# Patient Record
Sex: Male | Born: 1950 | Race: White | Hispanic: No | Marital: Married | Smoking: Never smoker
Health system: Southern US, Community
[De-identification: ages and names within clinical notes are randomized; demographics above are authoritative.]

## PROBLEM LIST (undated history)

## (undated) DIAGNOSIS — E7401 von Gierke disease: Secondary | ICD-10-CM

## (undated) DIAGNOSIS — J45909 Unspecified asthma, uncomplicated: Secondary | ICD-10-CM

## (undated) DIAGNOSIS — I4891 Unspecified atrial fibrillation: Secondary | ICD-10-CM

## (undated) DIAGNOSIS — B019 Varicella without complication: Secondary | ICD-10-CM

## (undated) DIAGNOSIS — N200 Calculus of kidney: Secondary | ICD-10-CM

## (undated) DIAGNOSIS — E785 Hyperlipidemia, unspecified: Secondary | ICD-10-CM

## (undated) DIAGNOSIS — D126 Benign neoplasm of colon, unspecified: Secondary | ICD-10-CM

## (undated) DIAGNOSIS — N529 Male erectile dysfunction, unspecified: Secondary | ICD-10-CM

## (undated) DIAGNOSIS — I10 Essential (primary) hypertension: Secondary | ICD-10-CM

## (undated) DIAGNOSIS — B059 Measles without complication: Secondary | ICD-10-CM

## (undated) HISTORY — DX: Male erectile dysfunction, unspecified: N52.9

## (undated) HISTORY — DX: Calculus of kidney: N20.0

## (undated) HISTORY — DX: Benign neoplasm of colon, unspecified: D12.6

## (undated) HISTORY — DX: Measles without complication: B05.9

## (undated) HISTORY — DX: Essential (primary) hypertension: I10

## (undated) HISTORY — DX: Varicella without complication: B01.9

## (undated) HISTORY — PX: COLONOSCOPY: SHX174

## (undated) HISTORY — DX: Unspecified asthma, uncomplicated: J45.909

## (undated) HISTORY — DX: Unspecified atrial fibrillation: I48.91

## (undated) HISTORY — DX: Hyperlipidemia, unspecified: E78.5

## (undated) HISTORY — DX: Von Gierke disease: E74.01

---

## 1980-06-21 HISTORY — PX: CIRCUMCISION: SUR203

## 2000-06-21 HISTORY — PX: ACHILLES TENDON SURGERY: SHX542

## 2012-07-18 ENCOUNTER — Ambulatory Visit (INDEPENDENT_AMBULATORY_CARE_PROVIDER_SITE_OTHER): Admitting: Cardiovascular Disease

## 2012-07-18 ENCOUNTER — Encounter: Payer: Self-pay | Admitting: Cardiovascular Disease

## 2012-07-18 VITALS — BP 138/84 | HR 71 | Ht 74.0 in | Wt 222.0 lb

## 2012-07-18 DIAGNOSIS — E785 Hyperlipidemia, unspecified: Secondary | ICD-10-CM

## 2012-07-18 DIAGNOSIS — I4891 Unspecified atrial fibrillation: Secondary | ICD-10-CM

## 2012-07-18 DIAGNOSIS — I10 Essential (primary) hypertension: Secondary | ICD-10-CM | POA: Insufficient documentation

## 2012-07-18 MED ORDER — PROPRANOLOL HCL 20 MG PO TABS: 20.0000 mg | ORAL_TABLET | Freq: Three times a day (TID) | ORAL | Status: DC | PRN

## 2012-07-18 MED ORDER — DILTIAZEM HCL 30 MG PO TABS: 30.0000 mg | ORAL_TABLET | Freq: Three times a day (TID) | ORAL | Status: DC | PRN

## 2012-07-18 NOTE — Patient Instructions (Addendum)
You are doing well. No medication changes were made.  Please take diltiazem 30 mg dose and propranolol 20 mg dose if you go into atrial fibrillation. Call the office  Please call us if you have new issues that need to be addressed before your next appt.  Your physician wants you to follow-up in: 6 months.  You will receive a reminder letter in the mail two months in advance. If you don't receive a letter, please call our office to schedule the follow-up appointment.

## 2012-07-18 NOTE — Assessment & Plan Note (Signed)
Rare episodes of atrial fibrillation. We have given him a prescription for diltiazem 30 mg to take as needed, also propranolol 20 mg for breakthrough arrhythmia. He can take these together if no resolution of his arrhythmia in several hours, could repeat the dosing. We have asked him to call our office if he has prolonged arrhythmia. Long history of atrial fibrillation dating back over 10 years. No further workup at this time.

## 2012-07-18 NOTE — Assessment & Plan Note (Signed)
We did talk to him for a long time about cholesterol medication and hyperlipidemia. He is a very strong family history which concerns me. We'll try to obtain the cholesterol from recent blood work for insurance and make an evaluation based on these numbers. One option would be to try medication such as WelChol, fenofibrate, zetia for his hyperlipidemia.

## 2012-07-18 NOTE — Progress Notes (Signed)
Patient ID: Eugene Hodges, male    DOB: Oct 24, 1950, 62 y.o.   MRN: [id number]  HPI Comments: Mr. Eugene Hodges is a 62 year old gentleman patient of Dr. Juanetta Gosling, history of paroxysmal atrial fibrillation who presents to establish care. No recent echocardiogram or stress testing  He reports having history of hypertension, strong family history of heart disease. Brother died in his 46s, sister died in her 61s, father died in his 54s all from cardiac disease. He does not want to be on a statin. He is on a statin for 12 years and stopped this on his own after some myalgias. After reading about statins, he has decided he does not want to be on a statin. He previously tried Zocor, Lipitor, Crestor.  He does wear having rare episodes of atrial fibrillation, 3-D her episodes, several smaller episodes. Typically they last less than 24 hours. He has one episode lasting 24 hours once per year. Last episode was January of this year 2014 lasting for 6 hours. He does not have any medications that he takes on an as-needed basis for breakthrough atrial fibrillation. He does have occasional PVCs and reports sometimes is unable to tell the difference between ectopy and atrial fibrillation.  Otherwise he reports that he is doing well with no complaints. He is active, likes to workout on a regular basis and watch his diet. He does not know his most recent cholesterol though he had recent lab work for insurance  EKG shows normal sinus rhythm with rate 71 beats per minute with no significant ST or T wave changes   Outpatient Encounter Prescriptions as of 07/18/2012  Medication Sig Dispense Refill  . aspirin 325 MG tablet Take 325 mg by mouth daily.      Marland Kitchen diltiazem (DILACOR XR) 240 MG 24 hr capsule Take 240 mg by mouth daily.      Marland Kitchen lisinopril (PRINIVIL,ZESTRIL) 10 MG tablet Take 10 mg by mouth daily.      . meloxicam (MOBIC) 15 MG tablet Take 15 mg by mouth daily.      . vardenafil (LEVITRA) 20 MG tablet Take 20 mg by  mouth daily as needed.        Review of Systems  Constitutional: Negative.   HENT: Negative.   Eyes: Negative.   Respiratory: Negative.   Cardiovascular: Negative.   Gastrointestinal: Negative.   Musculoskeletal: Negative.   Skin: Negative.   Neurological: Negative.   Hematological: Negative.   Psychiatric/Behavioral: Negative.   All other systems reviewed and are negative.    BP 138/84  Pulse 71  Ht 6\' 2"  (1.88 m)  Wt 222 lb (100.699 kg)  BMI 28.50 kg/m2  Physical Exam  Nursing note and vitals reviewed. Constitutional: He is oriented to person, place, and time. He appears well-developed and well-nourished.  HENT:  Head: Normocephalic.  Nose: Nose normal.  Mouth/Throat: Oropharynx is clear and moist.  Eyes: Conjunctivae normal are normal. Pupils are equal, round, and reactive to light.  Neck: Normal range of motion. Neck supple. No JVD present.  Cardiovascular: Normal rate, regular rhythm, S1 normal, S2 normal, normal heart sounds and intact distal pulses.  Exam reveals no gallop and no friction rub.   No murmur heard. Pulmonary/Chest: Effort normal and breath sounds normal. No respiratory distress. He has no wheezes. He has no rales. He exhibits no tenderness.  Abdominal: Soft. Bowel sounds are normal. He exhibits no distension. There is no tenderness.  Musculoskeletal: Normal range of motion. He exhibits no edema and no tenderness.  Lymphadenopathy:    He has no cervical adenopathy.  Neurological: He is alert and oriented to person, place, and time. Coordination normal.  Skin: Skin is warm and dry. No rash noted. No erythema.  Psychiatric: He has a normal mood and affect. His behavior is normal. Judgment and thought content normal.           Assessment and Plan

## 2012-07-18 NOTE — Assessment & Plan Note (Signed)
Blood pressures relatively well controlled. We'll continue on diltiazem for blood pressure and heart rhythm control.

## 2013-01-17 ENCOUNTER — Ambulatory Visit (INDEPENDENT_AMBULATORY_CARE_PROVIDER_SITE_OTHER): Admitting: Cardiovascular Disease

## 2013-01-17 ENCOUNTER — Encounter: Payer: Self-pay | Admitting: Cardiovascular Disease

## 2013-01-17 VITALS — BP 120/88 | HR 77 | Ht 73.5 in | Wt 223.8 lb

## 2013-01-17 DIAGNOSIS — E785 Hyperlipidemia, unspecified: Secondary | ICD-10-CM

## 2013-01-17 DIAGNOSIS — I4949 Other premature depolarization: Secondary | ICD-10-CM

## 2013-01-17 DIAGNOSIS — I4891 Unspecified atrial fibrillation: Secondary | ICD-10-CM

## 2013-01-17 DIAGNOSIS — I10 Essential (primary) hypertension: Secondary | ICD-10-CM

## 2013-01-17 DIAGNOSIS — I493 Ventricular premature depolarization: Secondary | ICD-10-CM

## 2013-01-17 MED ORDER — CHOLINE FENOFIBRATE 135 MG PO CPDR: 135.0000 mg | DELAYED_RELEASE_CAPSULE | Freq: Every day | ORAL | Status: DC

## 2013-01-17 NOTE — Assessment & Plan Note (Signed)
PVCs back in May. If symptoms get worse, would repeat a Holter monitor. If excessive amount of ectopy, could try antiarrhythmic medication.

## 2013-01-17 NOTE — Assessment & Plan Note (Signed)
Cholesterol close to 260. He's not interested in a statin. Will start trilipex daily. Recheck cholesterol in 3 months time. Could add zetia 10 mg daily at that time if numbers are still high.

## 2013-01-17 NOTE — Addendum Note (Signed)
Addended by: Antonieta Iba on: 01/17/2013 06:23 PM   Modules accepted: Level of Service

## 2013-01-17 NOTE — Progress Notes (Signed)
Patient ID: Eugene Hodges, male    DOB: 04-Feb-1951, 62 y.o.   MRN: [id number]  HPI Comments: Mr. Eugene Hodges is a 62 year-old gentleman patient of Dr. Juanetta Gosling, history of paroxysmal atrial fibrillation who presents for routine followup No recent echocardiogram or stress testing  history of hypertension, strong family history of heart disease. Brother died in his 37s, sister died in her 7s, father died in his 48s all from cardiac disease. He does not want to be on a statin. He is on a statin for 12 years and stopped this on his own after some myalgias. After reading about statins, he has decided he does not want to be on a statin. He previously tried Zocor, Lipitor, Crestor.  He reports having palpitations in may 2014 consistent with PVCs. He had prior workup for ectopy, wore a Holter in the past showing PVCs. He does not think his ectopy was atrial fibrillation. Otherwise she feels well, is active. No symptoms concerning for atrial fibrillation. For his PVCs, he did take short acting diltiazem and propranolol. Uncertain if this helped.   He is active, likes to workout on a regular basis and watch his diet.   Cholesterol was 258 to his insurance workup  EKG shows normal sinus rhythm with rate 77 beats per minute with no significant ST or T wave changes   Outpatient Encounter Prescriptions as of 01/17/2013  Medication Sig Dispense Refill  . aspirin 325 MG tablet Take 325 mg by mouth daily.      Marland Kitchen diltiazem (CARDIZEM) 30 MG tablet Take 1 tablet (30 mg total) by mouth 3 (three) times daily as needed.  90 tablet  6  . diltiazem (DILACOR XR) 240 MG 24 hr capsule Take 240 mg by mouth daily.      Marland Kitchen lisinopril (PRINIVIL,ZESTRIL) 10 MG tablet Take 10 mg by mouth daily.      . propranolol (INDERAL) 20 MG tablet Take 1 tablet (20 mg total) by mouth 3 (three) times daily as needed.  90 tablet  6  . vardenafil (LEVITRA) 20 MG tablet Take 20 mg by mouth daily as needed.      . Choline Fenofibrate 135 MG  capsule Take 1 capsule (135 mg total) by mouth daily.  90 capsule  3    Review of Systems  Constitutional: Negative.   HENT: Negative.   Eyes: Negative.   Respiratory: Negative.   Cardiovascular: Negative.   Gastrointestinal: Negative.   Musculoskeletal: Negative.   Skin: Negative.   Neurological: Negative.   Psychiatric/Behavioral: Negative.   All other systems reviewed and are negative.    BP 120/88  Pulse 77  Ht 6' 1.5" (1.867 m)  Wt 223 lb 12 oz (101.492 kg)  BMI 29.12 kg/m2  Physical Exam  Nursing note and vitals reviewed. Constitutional: He is oriented to person, place, and time. He appears well-developed and well-nourished.  HENT:  Head: Normocephalic.  Nose: Nose normal.  Mouth/Throat: Oropharynx is clear and moist.  Eyes: Conjunctivae are normal. Pupils are equal, round, and reactive to light.  Neck: Normal range of motion. Neck supple. No JVD present.  Cardiovascular: Normal rate, regular rhythm, S1 normal, S2 normal, normal heart sounds and intact distal pulses.  Exam reveals no gallop and no friction rub.   No murmur heard. Pulmonary/Chest: Effort normal and breath sounds normal. No respiratory distress. He has no wheezes. He has no rales. He exhibits no tenderness.  Abdominal: Soft. Bowel sounds are normal. He exhibits no distension. There is no  tenderness.  Musculoskeletal: Normal range of motion. He exhibits no edema and no tenderness.  Lymphadenopathy:    He has no cervical adenopathy.  Neurological: He is alert and oriented to person, place, and time. Coordination normal.  Skin: Skin is warm and dry. No rash noted. No erythema.  Psychiatric: He has a normal mood and affect. His behavior is normal. Judgment and thought content normal.      Assessment and Plan

## 2013-01-17 NOTE — Assessment & Plan Note (Signed)
Blood pressure is well controlled on today's visit. No changes made to the medications. 

## 2013-01-17 NOTE — Patient Instructions (Addendum)
You are doing well. Please start trilipex once daily  Please recheck cholesterol in a few months, October Call the day before to reserve a spot  Please call us if you have new issues that need to be addressed before your next appt.  Your physician wants you to follow-up in: 12 months.  You will receive a reminder letter in the mail two months in advance. If you don't receive a letter, please call our office to schedule the follow-up appointment.

## 2013-01-17 NOTE — Assessment & Plan Note (Signed)
No recent episodes of atrial fibrillation. He does report having PVCs. I suggested he call our office if symptoms get worse. Currently resolved.

## 2013-01-28 ENCOUNTER — Other Ambulatory Visit: Payer: Self-pay | Admitting: Cardiovascular Disease

## 2014-03-09 ENCOUNTER — Emergency Department (HOSPITAL_COMMUNITY)

## 2014-03-09 ENCOUNTER — Encounter (HOSPITAL_COMMUNITY): Payer: Self-pay | Admitting: Emergency Medicine

## 2014-03-09 ENCOUNTER — Emergency Department (HOSPITAL_COMMUNITY)
Admission: EM | Admit: 2014-03-09 | Discharge: 2014-03-09 | Disposition: A | Discharge status: Home / Self Care | Attending: Emergency Medicine | Admitting: Emergency Medicine

## 2014-03-09 DIAGNOSIS — I4891 Unspecified atrial fibrillation: Secondary | ICD-10-CM | POA: Insufficient documentation

## 2014-03-09 DIAGNOSIS — S0990XA Unspecified injury of head, initial encounter: Secondary | ICD-10-CM | POA: Insufficient documentation

## 2014-03-09 DIAGNOSIS — Y9389 Activity, other specified: Secondary | ICD-10-CM | POA: Diagnosis not present

## 2014-03-09 DIAGNOSIS — Z8719 Personal history of other diseases of the digestive system: Secondary | ICD-10-CM | POA: Diagnosis not present

## 2014-03-09 DIAGNOSIS — S79919A Unspecified injury of unspecified hip, initial encounter: Secondary | ICD-10-CM | POA: Diagnosis present

## 2014-03-09 DIAGNOSIS — Z8639 Personal history of other endocrine, nutritional and metabolic disease: Secondary | ICD-10-CM | POA: Insufficient documentation

## 2014-03-09 DIAGNOSIS — Y92410 Unspecified street and highway as the place of occurrence of the external cause: Secondary | ICD-10-CM | POA: Diagnosis not present

## 2014-03-09 DIAGNOSIS — I10 Essential (primary) hypertension: Secondary | ICD-10-CM | POA: Insufficient documentation

## 2014-03-09 DIAGNOSIS — Z87448 Personal history of other diseases of urinary system: Secondary | ICD-10-CM | POA: Diagnosis not present

## 2014-03-09 DIAGNOSIS — S0993XA Unspecified injury of face, initial encounter: Secondary | ICD-10-CM | POA: Insufficient documentation

## 2014-03-09 DIAGNOSIS — Z79899 Other long term (current) drug therapy: Secondary | ICD-10-CM | POA: Diagnosis not present

## 2014-03-09 DIAGNOSIS — Z8619 Personal history of other infectious and parasitic diseases: Secondary | ICD-10-CM | POA: Diagnosis not present

## 2014-03-09 DIAGNOSIS — S79929A Unspecified injury of unspecified thigh, initial encounter: Secondary | ICD-10-CM | POA: Insufficient documentation

## 2014-03-09 DIAGNOSIS — Z862 Personal history of diseases of the blood and blood-forming organs and certain disorders involving the immune mechanism: Secondary | ICD-10-CM | POA: Diagnosis not present

## 2014-03-09 DIAGNOSIS — Z87442 Personal history of urinary calculi: Secondary | ICD-10-CM | POA: Insufficient documentation

## 2014-03-09 DIAGNOSIS — J45909 Unspecified asthma, uncomplicated: Secondary | ICD-10-CM | POA: Diagnosis not present

## 2014-03-09 DIAGNOSIS — M25552 Pain in left hip: Secondary | ICD-10-CM

## 2014-03-09 DIAGNOSIS — S199XXA Unspecified injury of neck, initial encounter: Secondary | ICD-10-CM
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
## 2014-03-09 NOTE — ED Notes (Signed)
Pt comfortable with discharge and follow up instructions. No prescriptions. 

## 2014-03-10 NOTE — ED Provider Notes (Signed)
Medical screening examination/treatment/procedure(s) were performed by non-physician practitioner and as supervising physician I was immediately available for consultation/collaboration.   EKG Interpretation None        Delice Bison Ward, DO 03/10/14 3888

## 2017-08-08 ENCOUNTER — Telehealth: Payer: Self-pay | Admitting: Cardiovascular Disease

## 2017-08-08 NOTE — Telephone Encounter (Signed)
Received records request from [facility], forwarded to [facility] for processing

## 2018-11-26 ENCOUNTER — Emergency Department: Payer: Medicare Other

## 2018-11-26 ENCOUNTER — Other Ambulatory Visit: Payer: Self-pay

## 2018-11-26 ENCOUNTER — Emergency Department
Admission: EM | Admit: 2018-11-26 | Discharge: 2018-11-26 | Disposition: A | Discharge status: Home / Self Care | Payer: Medicare Other | Attending: Emergency Medicine | Admitting: Emergency Medicine

## 2018-11-26 DIAGNOSIS — R42 Dizziness and giddiness: Secondary | ICD-10-CM | POA: Insufficient documentation

## 2018-11-26 DIAGNOSIS — R197 Diarrhea, unspecified: Secondary | ICD-10-CM

## 2018-11-26 DIAGNOSIS — Z79899 Other long term (current) drug therapy: Secondary | ICD-10-CM | POA: Insufficient documentation

## 2018-11-26 DIAGNOSIS — I10 Essential (primary) hypertension: Secondary | ICD-10-CM | POA: Insufficient documentation

## 2018-11-26 DIAGNOSIS — R0789 Other chest pain: Secondary | ICD-10-CM | POA: Insufficient documentation

## 2018-11-26 LAB — CBC WITH DIFFERENTIAL/PLATELET
Abs Immature Granulocytes: 0.02 10*3/uL (ref 0.00–0.07)
Basophils Absolute: 0.1 10*3/uL (ref 0.0–0.1)
Basophils Relative: 1 %
Eosinophils Absolute: 0.1 10*3/uL (ref 0.0–0.5)
Eosinophils Relative: 2 %
HCT: 40.4 % (ref 39.0–52.0)
Hemoglobin: 12.9 g/dL — ABNORMAL LOW (ref 13.0–17.0)
Immature Granulocytes: 0 %
Lymphocytes Relative: 43 %
Lymphs Abs: 2.3 10*3/uL (ref 0.7–4.0)
MCH: 30.9 pg (ref 26.0–34.0)
MCHC: 31.9 g/dL (ref 30.0–36.0)
MCV: 96.7 fL (ref 80.0–100.0)
Monocytes Absolute: 0.5 10*3/uL (ref 0.1–1.0)
Monocytes Relative: 9 %
Neutro Abs: 2.3 10*3/uL (ref 1.7–7.7)
Neutrophils Relative %: 45 %
Platelets: 217 10*3/uL (ref 150–400)
RBC: 4.18 MIL/uL — ABNORMAL LOW (ref 4.22–5.81)
RDW: 12.3 % (ref 11.5–15.5)
WBC: 5.2 10*3/uL (ref 4.0–10.5)
nRBC: 0 % (ref 0.0–0.2)

## 2018-11-26 LAB — CBC
HCT: 39.7 % (ref 39.0–52.0)
Hemoglobin: 13 g/dL (ref 13.0–17.0)
MCH: 30.7 pg (ref 26.0–34.0)
MCHC: 32.7 g/dL (ref 30.0–36.0)
MCV: 93.9 fL (ref 80.0–100.0)
Platelets: 207 10*3/uL (ref 150–400)
RBC: 4.23 MIL/uL (ref 4.22–5.81)
RDW: 12.1 % (ref 11.5–15.5)
WBC: 5.1 10*3/uL (ref 4.0–10.5)
nRBC: 0 % (ref 0.0–0.2)

## 2018-11-26 LAB — TROPONIN I
Troponin I: <0.03 ng/mL (ref <0.03)
Troponin I: <0.03 ng/mL (ref <0.03)

## 2018-11-26 LAB — TYPE AND SCREEN
ABO/RH(D): O POS
Antibody Screen: NEGATIVE

## 2018-11-26 LAB — BASIC METABOLIC PANEL
Anion gap: 7 (ref 5–15)
BUN: 22 mg/dL (ref 8–23)
CO2: 23 mmol/L (ref 22–32)
Calcium: 8.7 mg/dL — ABNORMAL LOW (ref 8.9–10.3)
Chloride: 107 mmol/L (ref 98–111)
Creatinine, Ser: 0.98 mg/dL (ref 0.61–1.24)
GFR calc Af Amer: >60 mL/min (ref >60)
GFR calc non Af Amer: >60 mL/min (ref >60)
Glucose, Bld: 115 mg/dL — ABNORMAL HIGH (ref 70–99)
Potassium: 3.9 mmol/L (ref 3.5–5.1)
Sodium: 137 mmol/L (ref 135–145)

## 2018-11-26 LAB — PROTIME-INR
INR: 1.5 — ABNORMAL HIGH (ref 0.8–1.2)
Prothrombin Time: 17.7 seconds — ABNORMAL HIGH (ref 11.4–15.2)

## 2018-11-26 NOTE — Discharge Instructions (Signed)
You had a trace of blood in your stool.  Your hemoglobin however is stable at 13.  Your EKG is normal and your cardiac enzymes are normal.  You are not orthostatic.  This means your blood pressure does not drop when you stand up.  We have talked about admission to the hospital or transfer to [facility] but he would prefer to go home.  This is certainly your choice but does limit our ability to monitor you.  If you feel worse in any way or change your mind please return to the emergency room.  Feeling worse would include anything of significance to you in terms of your health but most particularly we would be worried about bleeding from your bottom black stool, lightheadedness or chest pain or shortness of breath.  We do advise that you stop your Xarelto until you can see a GI doctor or your primary care doctor in the next few days.

## 2018-11-26 NOTE — ED Notes (Signed)
Pt reports that his pain started while he was using the bathroom. Pt reports that he feels much better now.

## 2018-11-26 NOTE — ED Triage Notes (Signed)
Patient reports got up to go to the bathroom and had diarrhea.  While having bowel movement began to feel "bad", the started with chest pain, short of breath and dizziness.

## 2018-11-26 NOTE — ED Provider Notes (Addendum)
[facility] Emergency Department Provider Note  ____________________________________________   I have reviewed the triage vital signs and the nursing notes. Where available I have reviewed prior notes and, if possible and indicated, outside hospital notes.    HISTORY  Chief Complaint Chest Pain and Dizziness    HPI Eugene Hodges is a 68 y.o. male  Patient seen and evaluated during the coronavirus epidemic during a time with low staffing, with the below medical problems including a history of paroxysmal atrial fibrillation on blood thinners.  States he did have some blood from his bottom after his that she had blood sugars but he is never had any significant GI bleed.  Today he had a large bowel movement was watery, he states that it happened prior to coming in.  He did not see if it was bloody or black.  He states while he was on the toilet he became diaphoretic and felt "weirdness" in his chest.  It was an uncomfortable feeling.  He also felt lightheaded during the course of the bowel movement.  This all resolved within 10 minutes as soon as he got off the toilet he felt better.  He has had no chest pain since that time.  Denies any shortness of breath no exertional symptoms, he is able to do his activities of daily life with no difficulty up until this moment.  And he now feels "great" in his objective is to go home. No vomiting, no abdominal pain, he states he is compliant with his medications including his Xarelto.  Past Medical History:  Diagnosis Date  . A-fib   . Asthma   . Benign neoplasm of colon   . Calculus of kidney   . G6P deficiency (glucose-6-phosphatase deficiency)   . Hyperlipidemia   . Impotence of organic origin   . Measles without mention of complication   . Unspecified essential hypertension   . Varicella without mention of complication     Patient Active Problem List   Diagnosis Date Noted  . PVC's (premature ventricular contractions)  01/17/2013  . A-fib ([place]) 07/18/2012  . Hyperlipidemia 07/18/2012  . HTN (hypertension) 07/18/2012    Past Surgical History:  Procedure Laterality Date  . ACHILLES TENDON SURGERY  2002  . CIRCUMCISION  1982  . COLONOSCOPY      Prior to Admission medications   Medication Sig Start Date End Date Taking? Authorizing Provider  aspirin 325 MG tablet Take 325 mg by mouth daily.    [provider]  diltiazem (CARDIZEM) 30 MG tablet Take 1 tablet (30 mg total) by mouth 3 (three) times daily as needed. 07/18/12   Minna Merritts, MD  diltiazem (DILACOR XR) 240 MG 24 hr capsule Take 240 mg by mouth daily.    [provider]  lisinopril (PRINIVIL,ZESTRIL) 10 MG tablet Take 10 mg by mouth daily.    [provider]  propranolol (INDERAL) 20 MG tablet Take 1 tablet (20 mg total) by mouth 3 (three) times daily as needed. 07/18/12   Minna Merritts, MD  vardenafil (LEVITRA) 20 MG tablet Take 20 mg by mouth daily as needed.    [provider]    Allergies Sulfa antibiotics  Family History  Problem Relation Age of Onset  . Heart disease Mother     Social History Social History   Tobacco Use  . Smoking status: Never Smoker  Substance Use Topics  . Alcohol use: No  . Drug use: No    Review of Systems  Constitutional: No fever/chills Eyes: No visual changes. ENT: No sore throat. No stiff neck no neck pain Cardiovascular: See HPI Respiratory: Denies shortness of breath. Gastrointestinal:   The HPI Genitourinary: Negative for dysuria. Musculoskeletal: Negative lower extremity swelling Skin: Negative for rash. Neurological: Negative for severe headaches, focal weakness or numbness.   ____________________________________________   PHYSICAL EXAM:  VITAL SIGNS: ED Triage Vitals  Enc Vitals Group     BP 11/26/18 0424 130/77     Pulse Rate 11/26/18 0424 79     Resp 11/26/18 0424 18     Temp 11/26/18 0424 97.8 F (36.6 C)     Temp Source  11/26/18 0424 Oral     SpO2 11/26/18 0424 98 %     Weight 11/26/18 0421 230 lb (104.3 kg)     Height 11/26/18 0421 6' 1.5" (1.867 m)     Head Circumference --      Peak Flow --      Pain Score 11/26/18 0421 0     Pain Loc --      Pain Edu? --      Excl. in [place]? --     Constitutional: Alert and oriented. Well appearing and in no acute distress. Eyes: Conjunctivae are normal Head: Atraumatic HEENT: No congestion/rhinnorhea. Mucous membranes are moist.  Oropharynx non-erythematous Neck:   Nontender with no meningismus, no masses, no stridor Cardiovascular: Normal rate, regular rhythm. Grossly normal heart sounds.  Good peripheral circulation. Respiratory: Normal respiratory effort.  No retractions. Lungs CTAB. Abdominal: Soft and nontender. No distention. No guarding no rebound Rectal: There is nothing really in the vault in terms of stool but the secretions are faintly guaiac positive Back:  There is no focal tenderness or step off.  there is no midline tenderness there are no lesions noted. there is no CVA tenderness Musculoskeletal: No lower extremity tenderness, no upper extremity tenderness. No joint effusions, no DVT signs strong distal pulses no edema Neurologic:  Normal speech and language. No gross focal neurologic deficits are appreciated.  Skin:  Skin is warm, dry and intact. No rash noted. Psychiatric: Mood and affect are normal. Speech and behavior are normal.  ____________________________________________   LABS (all labs ordered are listed, but only abnormal results are displayed)  Labs Reviewed  BASIC METABOLIC PANEL - Abnormal; Notable for the following components:      Result Value   Glucose, Bld 115 (*)    Calcium 8.7 (*)    All other components within normal limits  CBC  TROPONIN I  TROPONIN I  PROTIME-INR  CBC WITH DIFFERENTIAL/PLATELET  TYPE AND SCREEN    Pertinent labs  results that were available during my care of the patient were reviewed by me and  considered in my medical decision making (see chart for details). ____________________________________________  EKG  I personally interpreted any EKGs ordered by me or triage Rate 70 no acute ST elevation or depression, sinus rhythm, normal axis, unremarkable EKG ____________________________________________  RADIOLOGY  Pertinent labs & imaging results that were available during my care of the patient were reviewed by me and considered in my medical decision making (see chart for details). If possible, patient and/or family made aware of any abnormal findings.  No results found. ____________________________________________    PROCEDURES  Procedure(s) performed: None  Procedures  Critical Care performed: None  ____________________________________________   INITIAL IMPRESSION / ASSESSMENT AND PLAN / ED COURSE  Pertinent labs & imaging results that were available during my care of the patient were reviewed  by me and considered in my medical decision making (see chart for details). Patient had diarrhea today, has been felt lightheaded, did not have any significant chest pain but felt "weird all over and sweaty" well this was happening.  He had an uncomfortable feeling in his chest among other places.  It completely resolved after he stopped having a bowel movement.  He does not believe he had any rectal bleeding but he does not know for sure.  Rectal exam here is faintly positive.  Vital signs and hemoglobin are reassuring troponin is reassuring EKG is reassuring, low suspicion for ACS PE or dissection today however I am concerned that the patient is on Xarelto and is faintly guaiac positive.  We will recheck a CBC, will get orthostatic vital signs and we will reassess.  Patient strong preference is to go home.  I will send a second troponin also as a precaution.  We will then discuss with the patient whether or not going home is the best option for him.  Obviously this will be shared  decision making.   ----------------------------------------- 9:43 AM on 11/26/2018 -----------------------------------------  Patient's hemoglobin is unchanged, his troponin is negative, he and I had an extensive talk.  He does not believe he had any significant bleeding this morning.  Certainly no evidence of it here.  He is not orthostatic and his vital signs are reassuring his hemoglobin is reassuring.  And there was no frank blood noted certainly.  Just a trace.  Recently, as I explained to the patient, there is no way for me to know how much GI bleeding he has had or will have but all of our indications are that he is at this moment stable.  I have nonetheless advised admission, or transfer to the [place] at the patient's discretion but he would prefer to go home.  I think that he is certainly within his rights to make this decision and I think he understands the risk benefits and alternatives to going home.  I did discuss the benefits of staying would be that we could continue to monitor him, we would monitor his hemoglobin, and we would have GI see him.  Similar things could be affected by a trip to the [place].  In addition, he could have cardiology input about his discomfort.  However, he has been absolutely asymptomatic here and he is adamant that he wants to go home.  I do not think this is unreasonable.  Certainly the patient understands the limitations this places upon the work-up.  His abdomen is benign his vital signs are reassuring and his findings are all reassuring as well.  He did have diarrhea this morning and he is on Xarelto.  I have advised him to stop taking his Xarelto as he is not currently in atrial fibrillation, and has not been for some time, and at this time I think the risk of bleeding outweighs any likely CVA preventative risks in the short-term.  We will refer him to GI he will follow-up with his [place] cardiologist.  Return precautions and follow-up given and understood.  Given that this  is the patient's plan he feels most comfortable with it.   ____________________________________________   FINAL CLINICAL IMPRESSION(S) / ED DIAGNOSES  Final diagnoses:  None      This chart was dictated using voice recognition software.  Despite best efforts to proofread,  errors can occur which can change meaning.      Schuyler Amor, MD 11/26/18 [phone]  Schuyler Amor, MD 11/26/18 [phone]

## 2019-10-03 ENCOUNTER — Ambulatory Visit
Admission: EM | Admit: 2019-10-03 | Discharge: 2019-10-03 | Disposition: A | Discharge status: Home / Self Care | Payer: Medicare Other | Attending: Family Medicine | Admitting: Family Medicine

## 2019-10-03 ENCOUNTER — Other Ambulatory Visit: Payer: Self-pay

## 2019-10-03 ENCOUNTER — Encounter: Payer: Self-pay | Admitting: Emergency Medicine

## 2019-10-03 DIAGNOSIS — R55 Syncope and collapse: Secondary | ICD-10-CM

## 2019-10-03 LAB — CBC WITH DIFFERENTIAL/PLATELET
Abs Immature Granulocytes: 0.02 10*3/uL (ref 0.00–0.07)
Basophils Absolute: 0.1 10*3/uL (ref 0.0–0.1)
Basophils Relative: 1 %
Eosinophils Absolute: 0.2 10*3/uL (ref 0.0–0.5)
Eosinophils Relative: 4 %
HCT: 42.5 % (ref 39.0–52.0)
Hemoglobin: 13.9 g/dL (ref 13.0–17.0)
Immature Granulocytes: 0 %
Lymphocytes Relative: 41 %
Lymphs Abs: 2.1 10*3/uL (ref 0.7–4.0)
MCH: 31.2 pg (ref 26.0–34.0)
MCHC: 32.7 g/dL (ref 30.0–36.0)
MCV: 95.3 fL (ref 80.0–100.0)
Monocytes Absolute: 0.6 10*3/uL (ref 0.1–1.0)
Monocytes Relative: 11 %
Neutro Abs: 2.2 10*3/uL (ref 1.7–7.7)
Neutrophils Relative %: 43 %
Platelets: 221 10*3/uL (ref 150–400)
RBC: 4.46 MIL/uL (ref 4.22–5.81)
RDW: 12 % (ref 11.5–15.5)
WBC: 5.1 10*3/uL (ref 4.0–10.5)
nRBC: 0 % (ref 0.0–0.2)

## 2019-10-03 LAB — BASIC METABOLIC PANEL
Anion gap: 7 (ref 5–15)
BUN: 22 mg/dL (ref 8–23)
CO2: 26 mmol/L (ref 22–32)
Calcium: 8.9 mg/dL (ref 8.9–10.3)
Chloride: 102 mmol/L (ref 98–111)
Creatinine, Ser: 0.98 mg/dL (ref 0.61–1.24)
GFR calc Af Amer: >60 mL/min (ref >60)
GFR calc non Af Amer: >60 mL/min (ref >60)
Glucose, Bld: 102 mg/dL — ABNORMAL HIGH (ref 70–99)
Potassium: 4.2 mmol/L (ref 3.5–5.1)
Sodium: 135 mmol/L (ref 135–145)

## 2019-10-03 LAB — TROPONIN I (HIGH SENSITIVITY): Troponin I (High Sensitivity): 5 ng/L (ref <18)

## 2019-10-03 NOTE — ED Triage Notes (Addendum)
Patient states he was at the barber today and his BP dropped and he started feeling dizzy. They checked his BP and it was 80/46. He states he feels better now. He states he did not eat this morning and took his BP medication.

## 2019-10-03 NOTE — ED Provider Notes (Signed)
[facility]    CSN: [id number] Arrival date & time: 10/03/19  0906 History   Chief Complaint Chief Complaint  Patient presents with  . Hypotension   HPI  69 year old male presents with the above complaint.  Patient states that he got up this morning and did not eat very much.  He took his blood pressure medication and went to the barbershop.  He states that he was sitting in the chair and felt as if he was going to pass out.  Patient states that he got very diaphoretic.  He was laid back in the chair and after approximately 10 to 15 minutes he began to feel better.  He states that he was advised to come in by his wife for evaluation.  Denies chest pain.  Denies shortness of breath.  He is currently taking carvedilol for blood pressure.  He is unsure of the dosing.  Currently feeling well.  Blood pressure stable currently at 122/80.  He states that his blood pressure was taken when he felt as if he was going to pass out and his blood pressure was 80/46.  No other complaints at this time  Past Medical History:  Diagnosis Date  . A-fib ([place])   . Asthma   . Benign neoplasm of colon   . Calculus of kidney   . 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 Hyperlipidemia   . Impotence of organic origin   . Measles without mention of complication   . Unspecified essential hypertension   . Varicella without mention of complication     Patient Active Problem List   Diagnosis Date Noted  . PVC's (premature ventricular contractions) 01/17/2013  . A-fib ([place]) 07/18/2012  . Hyperlipidemia 07/18/2012  . HTN (hypertension) 07/18/2012   Past Surgical History:  Procedure Laterality Date  . ACHILLES TENDON SURGERY  2002  . CIRCUMCISION  1982  . COLONOSCOPY      Home Medications    Prior to Admission medications   Medication Sig Start Date End Date Taking? Authorizing Provider  rivaroxaban (XARELTO) 10 MG TABS tablet Take 10 mg by mouth daily.   Yes [provider]  diltiazem (CARDIZEM) 30 MG tablet Take 1 tablet (30 mg total) by mouth 3 (three) times daily as needed. 07/18/12 10/03/19  Minna Merritts, MD  lisinopril (PRINIVIL,ZESTRIL) 10 MG tablet Take 10 mg by mouth daily.  10/03/19  [provider]  propranolol (INDERAL) 20 MG tablet Take 1 tablet (20 mg total) by mouth 3 (three) times daily as needed. 07/18/12 10/03/19  Minna Merritts, MD  vardenafil (LEVITRA) 20 MG tablet Take 20 mg by mouth daily as needed.  10/03/19  [provider]    Family History Family History  Problem Relation Age of Onset  . Heart disease Mother     Social History Social History   Tobacco Use  . Smoking status: Never Smoker  . Smokeless tobacco: Never Used  Substance Use Topics  . Alcohol use: No  . Drug use: No     Allergies   Sulfa antibiotics   Review of Systems Review of Systems  Constitutional: Positive for diaphoresis.  Respiratory: Negative for shortness of breath.   Cardiovascular: Negative for chest pain.  Neurological: Positive for light-headedness.   Physical Exam Triage Vital Signs ED Triage Vitals  Enc Vitals Group     BP 10/03/19 0932 122/80     Pulse Rate 10/03/19 0932 63     Resp 10/03/19 0932 18  Temp 10/03/19 0932 97.9 F (36.6 C)     Temp Source 10/03/19 0932 Oral     SpO2 10/03/19 0932 97 %     Weight 10/03/19 0928 235 lb (106.6 kg)     Height 10/03/19 0928 6\' 1"  (1.854 m)     Head Circumference --      Peak Flow --      Pain Score 10/03/19 0928 0     Pain Loc --      Pain Edu? --      Excl. in [place]? --    Updated Vital Signs BP 122/80 (BP Location: Right Arm)   Pulse 63   Temp 97.9 F (36.6 C) (Oral)   Resp 18   Ht 6\' 1"  (1.854 m)   Wt 106.6 kg   SpO2 97%   BMI 31.00 kg/m   Visual Acuity Right Eye Distance:   Left Eye Distance:   Bilateral Distance:    Right Eye Near:   Left Eye Near:    Bilateral Near:     Physical Exam Vitals and nursing note reviewed.   Constitutional:      General: He is not in acute distress.    Appearance: Normal appearance. He is not ill-appearing.  HENT:     Head: Normocephalic and atraumatic.  Eyes:     General:        Right eye: No discharge.        Left eye: No discharge.     Conjunctiva/sclera: Conjunctivae normal.  Cardiovascular:     Rate and Rhythm: Normal rate and regular rhythm.     Heart sounds: No murmur.  Pulmonary:     Effort: Pulmonary effort is normal.     Breath sounds: Normal breath sounds. No wheezing, rhonchi or rales.  Neurological:     Mental Status: He is alert.  Psychiatric:        Mood and Affect: Mood normal.        Behavior: Behavior normal.    UC Treatments / Results  Labs (all labs ordered are listed, but only abnormal results are displayed) Labs Reviewed  BASIC METABOLIC PANEL - Abnormal; Notable for the following components:      Result Value   Glucose, Bld 102 (*)    All other components within normal limits  CBC WITH DIFFERENTIAL/PLATELET  TROPONIN I (HIGH SENSITIVITY)    EKG Interpretation: Sinus bradycardia at the rate of 59.  Normal axis.  Normal intervals.  Normal EKG.  Radiology No results found.  Procedures Procedures (including critical care time)  Medications Ordered in UC Medications - No data to display  Initial Impression / Assessment and Plan / UC Course  I have reviewed the triage vital signs and the nursing notes.  Pertinent labs & imaging results that were available during my care of the patient were reviewed by me and considered in my medical decision making (see chart for details).    69 year old male presents with hypotension and near syncope.  Laboratory studies and EKG unremarkable and reassuring today.  Advised to stay hydrated and be sure he eats regularly.  Also advised him to consider decreasing his carvedilol (cutting in half) and discussed this with his primary care physician at the [place].  Patient in agreement with the plan.  Final  Clinical Impressions(s) / UC Diagnoses   Final diagnoses:  Near syncope     Discharge Instructions     Labs and EKG normal.  Consider decreasing Coreg dose in 1/2 ([facility]). Stay hydrated.  Take care  Dr. Lacinda Axon    ED Prescriptions    None     PDMP not reviewed this encounter.   Coral Spikes, [place] 10/03/19 1035

## 2019-10-03 NOTE — Discharge Instructions (Signed)
Labs and EKG normal.  Consider decreasing Coreg dose in 1/2 ([facility]). Stay hydrated.  Take care  Dr. Lacinda Axon

## 2020-08-02 ENCOUNTER — Emergency Department
Admission: EM | Admit: 2020-08-02 | Discharge: 2020-08-02 | Disposition: A | Discharge status: Home / Self Care | Payer: Medicare Other | Attending: Emergency Medicine | Admitting: Emergency Medicine

## 2020-08-02 ENCOUNTER — Emergency Department: Payer: Medicare Other

## 2020-08-02 ENCOUNTER — Other Ambulatory Visit: Payer: Self-pay

## 2020-08-02 DIAGNOSIS — Z20822 Contact with and (suspected) exposure to covid-19: Secondary | ICD-10-CM | POA: Insufficient documentation

## 2020-08-02 DIAGNOSIS — Z85038 Personal history of other malignant neoplasm of large intestine: Secondary | ICD-10-CM | POA: Insufficient documentation

## 2020-08-02 DIAGNOSIS — Z79899 Other long term (current) drug therapy: Secondary | ICD-10-CM | POA: Insufficient documentation

## 2020-08-02 DIAGNOSIS — I10 Essential (primary) hypertension: Secondary | ICD-10-CM | POA: Insufficient documentation

## 2020-08-02 DIAGNOSIS — R55 Syncope and collapse: Secondary | ICD-10-CM | POA: Diagnosis not present

## 2020-08-02 DIAGNOSIS — J45909 Unspecified asthma, uncomplicated: Secondary | ICD-10-CM | POA: Diagnosis not present

## 2020-08-02 DIAGNOSIS — I4891 Unspecified atrial fibrillation: Secondary | ICD-10-CM | POA: Insufficient documentation

## 2020-08-02 DIAGNOSIS — Z7901 Long term (current) use of anticoagulants: Secondary | ICD-10-CM | POA: Diagnosis not present

## 2020-08-02 LAB — CBC WITH DIFFERENTIAL/PLATELET
Abs Immature Granulocytes: 0.01 10*3/uL (ref 0.00–0.07)
Basophils Absolute: 0 10*3/uL (ref 0.0–0.1)
Basophils Relative: 1 %
Eosinophils Absolute: 0.2 10*3/uL (ref 0.0–0.5)
Eosinophils Relative: 3 %
HCT: 39.4 % (ref 39.0–52.0)
Hemoglobin: 13 g/dL (ref 13.0–17.0)
Immature Granulocytes: 0 %
Lymphocytes Relative: 48 %
Lymphs Abs: 3.1 10*3/uL (ref 0.7–4.0)
MCH: 31.9 pg (ref 26.0–34.0)
MCHC: 33 g/dL (ref 30.0–36.0)
MCV: 96.6 fL (ref 80.0–100.0)
Monocytes Absolute: 0.5 10*3/uL (ref 0.1–1.0)
Monocytes Relative: 8 %
Neutro Abs: 2.6 10*3/uL (ref 1.7–7.7)
Neutrophils Relative %: 40 %
Platelets: 204 10*3/uL (ref 150–400)
RBC: 4.08 MIL/uL — ABNORMAL LOW (ref 4.22–5.81)
RDW: 12.4 % (ref 11.5–15.5)
WBC: 6.4 10*3/uL (ref 4.0–10.5)
nRBC: 0 % (ref 0.0–0.2)

## 2020-08-02 LAB — COMPREHENSIVE METABOLIC PANEL
ALT: 17 U/L (ref 0–44)
AST: 20 U/L (ref 15–41)
Albumin: 4.1 g/dL (ref 3.5–5.0)
Alkaline Phosphatase: 54 U/L (ref 38–126)
Anion gap: 11 (ref 5–15)
BUN: 24 mg/dL — ABNORMAL HIGH (ref 8–23)
CO2: 25 mmol/L (ref 22–32)
Calcium: 9.3 mg/dL (ref 8.9–10.3)
Chloride: 103 mmol/L (ref 98–111)
Creatinine, Ser: 1.08 mg/dL (ref 0.61–1.24)
GFR, Estimated: >60 mL/min (ref >60)
Glucose, Bld: 137 mg/dL — ABNORMAL HIGH (ref 70–99)
Potassium: 4.3 mmol/L (ref 3.5–5.1)
Sodium: 139 mmol/L (ref 135–145)
Total Bilirubin: 0.8 mg/dL (ref 0.3–1.2)
Total Protein: 7.4 g/dL (ref 6.5–8.1)

## 2020-08-02 LAB — RESP PANEL BY RT-PCR (FLU A&B, COVID) ARPGX2
Influenza A by PCR: NEGATIVE
Influenza B by PCR: NEGATIVE
SARS Coronavirus 2 by RT PCR: NEGATIVE

## 2020-08-02 LAB — TROPONIN I (HIGH SENSITIVITY)
Troponin I (High Sensitivity): 6 ng/L (ref <18)
Troponin I (High Sensitivity): 5 ng/L (ref <18)
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
## 2020-08-02 NOTE — ED Notes (Signed)
Patient transported to CT 

## 2020-08-02 NOTE — ED Notes (Signed)
Patient c/o pain in his right middle toe and states "I think it is broken I need the doctor to look at it". EDP informed.
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
## 2022-04-10 IMAGING — CT CT HEAD W/O CM
3 of 4 series · 15 of 47 positions shown, 18 images · non-contrast
Comparison: CT head and cervical spine 03/09/2014 report without
images

CLINICAL DATA: Syncopal episode.

EXAM:
CT HEAD WITHOUT CONTRAST
CT CERVICAL SPINE WITHOUT CONTRAST
TECHNIQUE: Multidetector CT imaging of the head and cervical spine was
performed following the standard protocol without intravenous
contrast. Multiplanar CT image reconstructions of the cervical spine
were also generated.

[Series 4: head wo · axial · 0.48mm/px · z∈[-100,+50]mm · 9 of 36 slices shown, 12 images]
[im 3/36  brain]
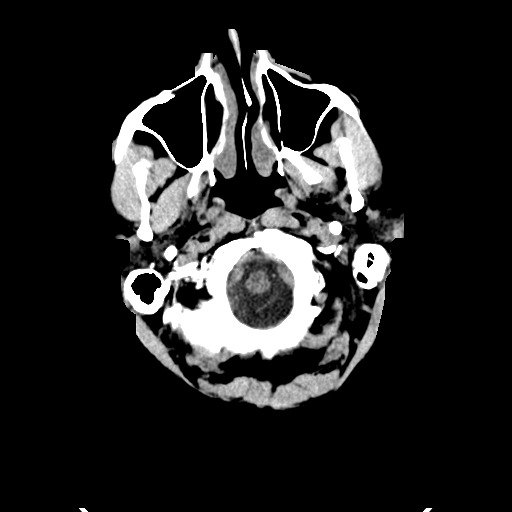
[im 3/36  bone]
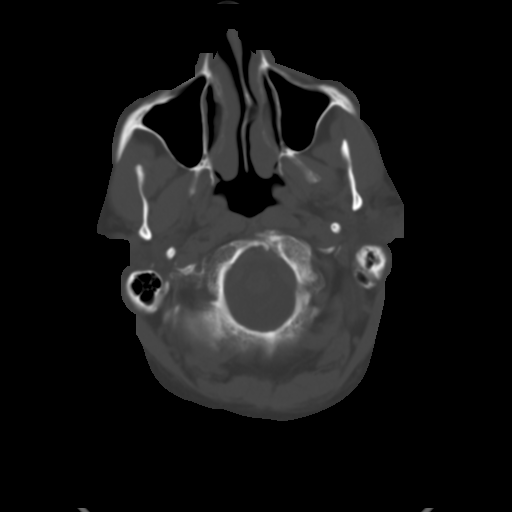
[im 7/36  brain]
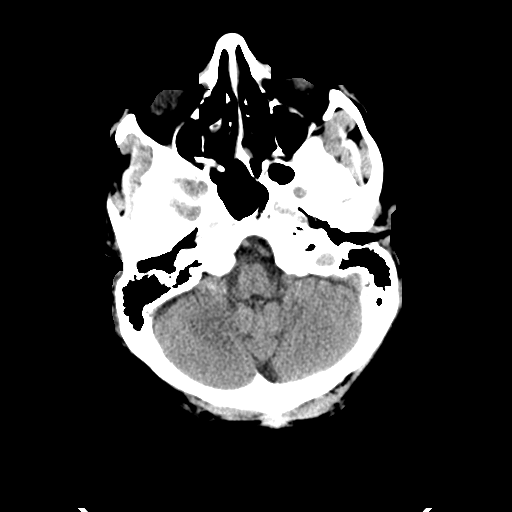
[im 11/36  brain]
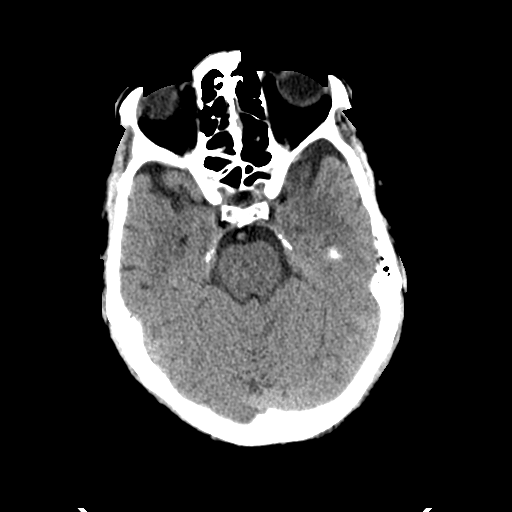
[im 15/36  brain]
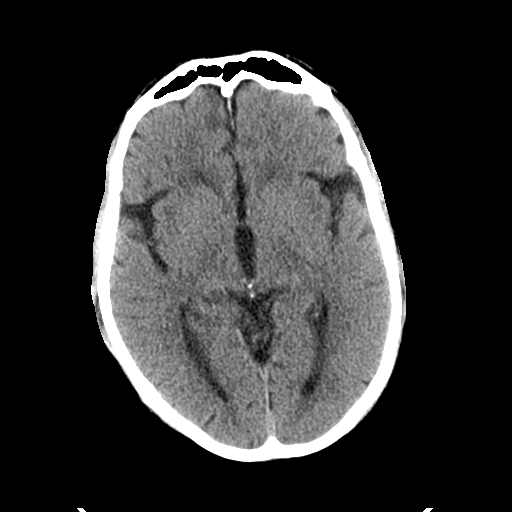
[im 19/36  brain]
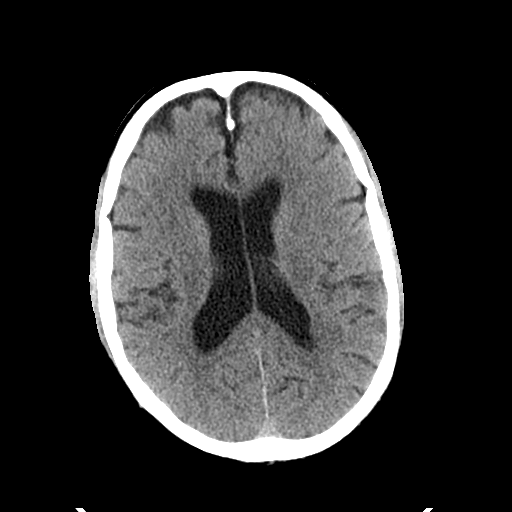
[im 19/36  bone]
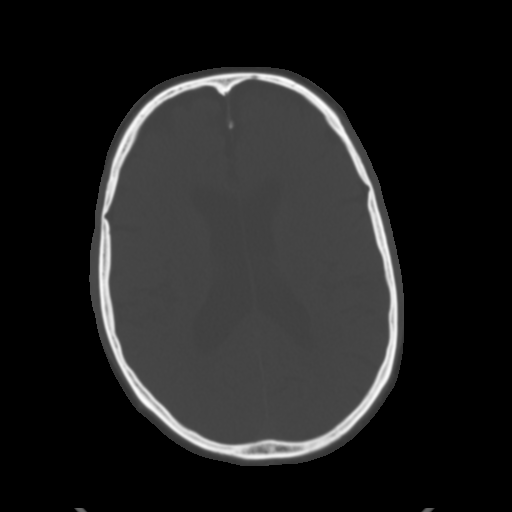
[im 21/36  brain]
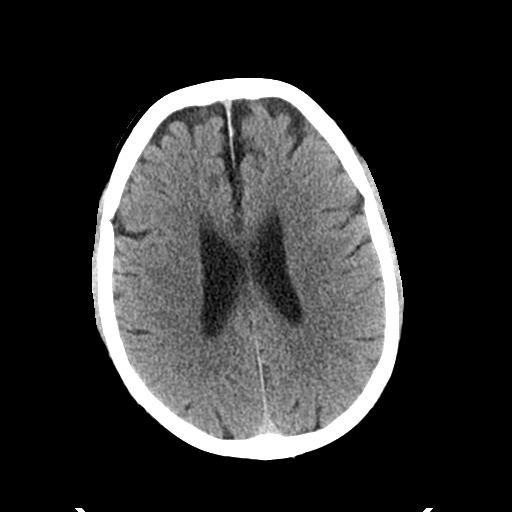
[im 25/36  brain]
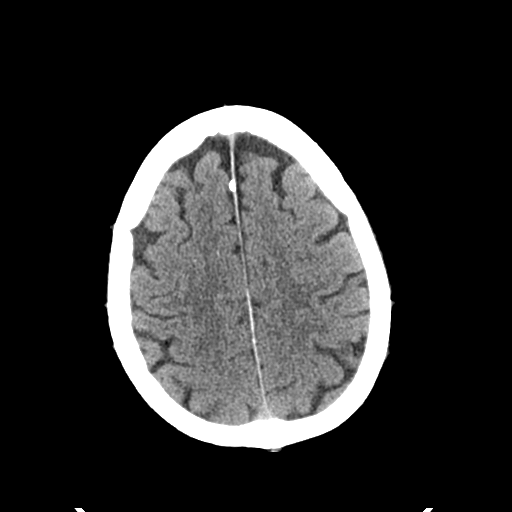
[im 29/36  brain]
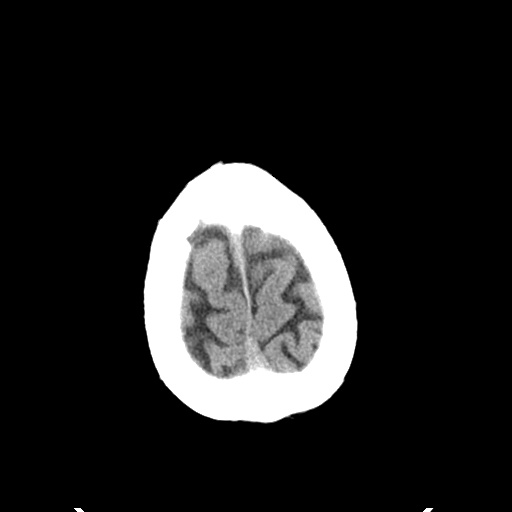
[im 33/36  brain]
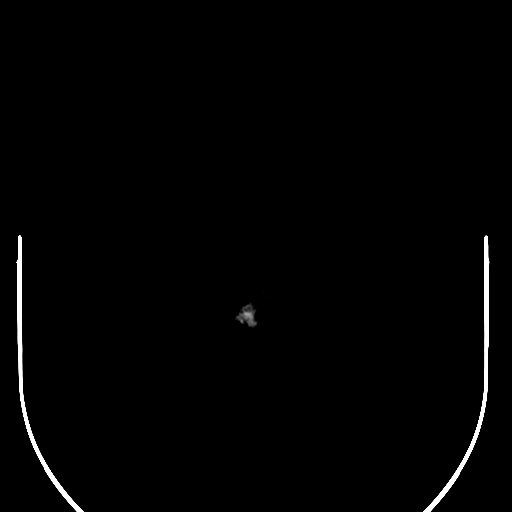
[im 33/36  bone]
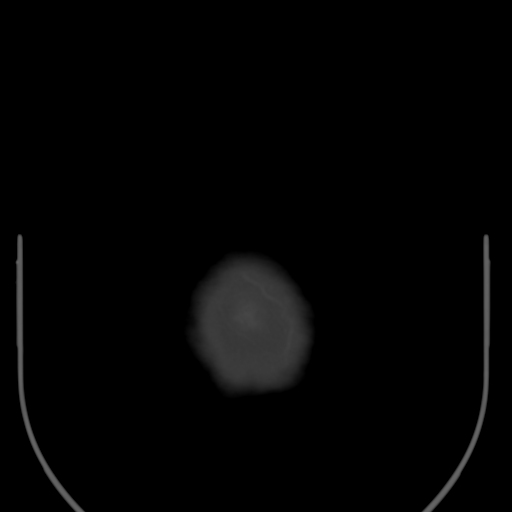

[Series 6: coronal soft tissue · coronal · 0.35mm/px · 3 of 76 slices shown]
[im 26/76  brain]
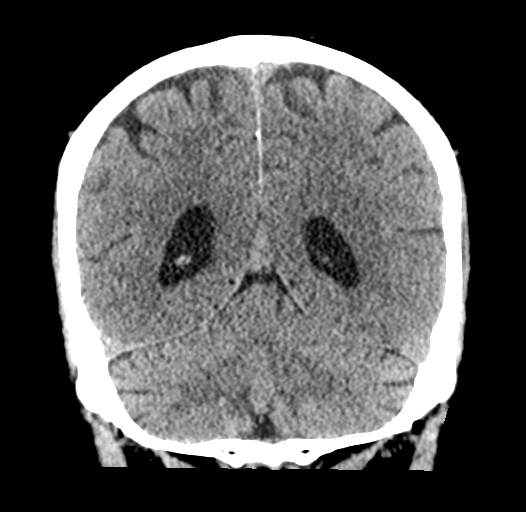
[im 34/76  brain]
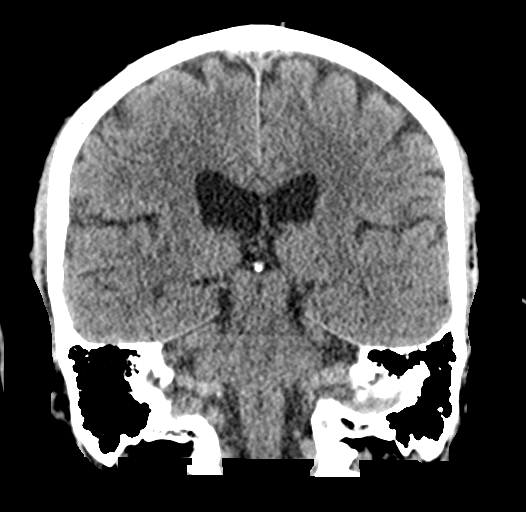
[im 42/76  brain]
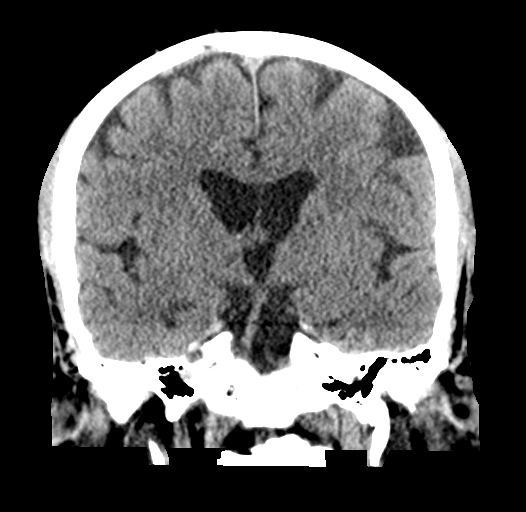

[Series 7: sagittal soft tissue · sagittal · 0.35mm/px · 3 of 62 slices shown]
[im 21/62  brain]
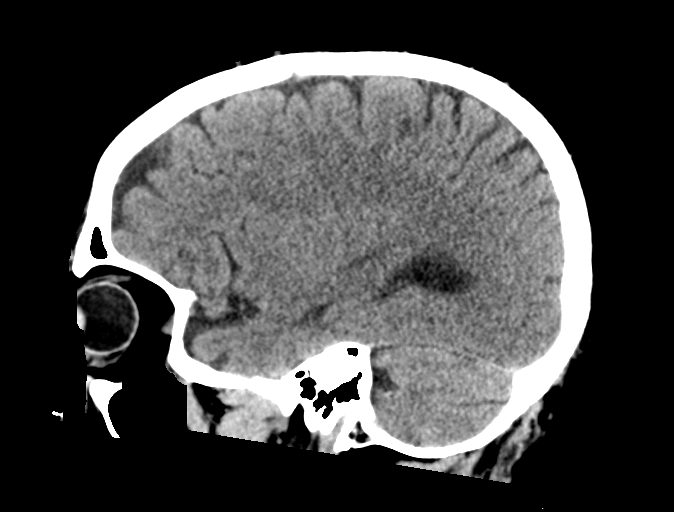
[im 31/62  brain]
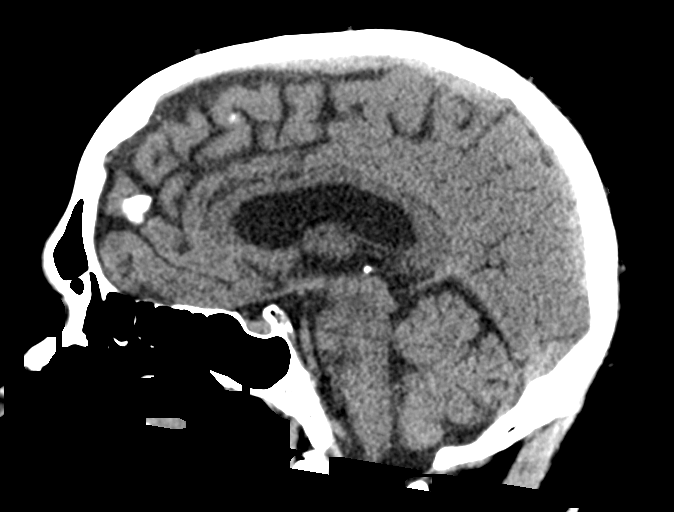
[im 41/62  brain]
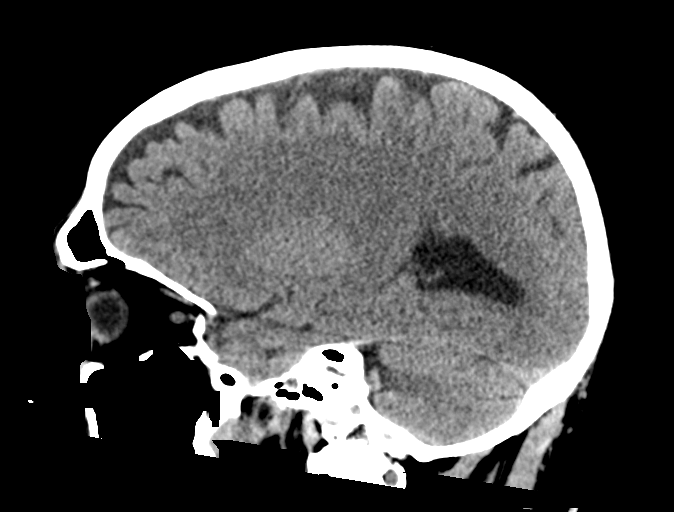

[15 of 47 positions shown; findings below may reference images not displayed]

FINDINGS: CT HEAD FINDINGS

Brain:

No evidence of large-territorial acute infarction. No parenchymal
hemorrhage. No mass lesion. No extra-axial collection.

No mass effect or midline shift. No hydrocephalus. Basilar cisterns
are patent.

Vascular: No hyperdense vessel.

Skull: No acute fracture or focal lesion.

Sinuses/Orbits: Paranasal sinuses and mastoid air cells are clear.
The orbits are unremarkable.

Other: None.

CT CERVICAL SPINE FINDINGS

Alignment: Normal.

Skull base and vertebrae: Multilevel degenerative changes of the
spine that are worse prominent at the C5-C6 levels. No acute
fracture. No aggressive appearing focal osseous lesion or focal
pathologic process.

Soft tissues and spinal canal: No prevertebral fluid or swelling. No
visible canal hematoma.

Upper chest: Biapical pleural/pulmonary scarring.

Other: None.
IMPRESSION: 1. No acute intracranial abnormality.
2. No acute displaced fracture or traumatic listhesis of the
cervical spine.
